# Patient Record
Sex: Male | Born: 2005 | Race: White | Hispanic: No | Marital: Single | Smoking: Never smoker
Health system: Southern US, Community
[De-identification: ages and names within clinical notes are randomized; demographics above are authoritative.]

---
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
## 2023-01-15 ENCOUNTER — Encounter: Payer: Self-pay | Admitting: Psychiatry

## 2023-01-15 ENCOUNTER — Telehealth: Payer: Commercial Managed Care - PPO | Admitting: Psychiatry

## 2023-01-15 DIAGNOSIS — F209 Schizophrenia, unspecified: Secondary | ICD-10-CM | POA: Diagnosis not present

## 2023-01-15 DIAGNOSIS — F422 Mixed obsessional thoughts and acts: Secondary | ICD-10-CM

## 2023-01-15 MED ORDER — SERTRALINE HCL 100 MG PO TABS: 200.0000 mg | ORAL_TABLET | Freq: Every day | ORAL | 1 refills | Status: DC

## 2023-01-15 NOTE — Progress Notes (Signed)
[facility] [address], [place]   Follow-up visit  Date of Service: 01/15/2023   Virtual Visit via Video Note  I connected with pt @ on 01/15/23 at  1:00 PM EST by a video enabled telemedicine application and verified that I am speaking with the correct person using two identifiers.   I discussed the limitations of evaluation and management by telemedicine and the availability of in person appointments. The patient expressed understanding and agreed to proceed.  I discussed the assessment and treatment plan with the patient. The patient was provided an opportunity to ask questions and all were answered. The patient agreed with the plan and demonstrated an understanding of the instructions.   The patient was advised to call back or seek an in-person evaluation if the symptoms worsen or if the condition fails to improve as anticipated.  I provided 25 minutes of non-face-to-face time during this encounter.  The patient was located at home.  The provider was located at [facility].   Alex Hymen, MD   CC/Purpose: Routine medication management follow up.   Alex Bell is a 17 y.o. male with a past psychiatric history of OCD, unspecified schizophrenia who presents today for a psychiatric follow up appointment. Patient is in the custody of himself.    The patient was last seen on 12/13/22, at which time the following plan was established: Medication management:             - Increase sertraline to 150mg  nightly for OCD             - Increase olanzapine to 15mg  nightly for schizophrenia             -  Metformin XR 2000mg  daily with dinner for antipsychotic related weight gain _______________________________________________________________________________________ Acute events/encounters since last visit: none    Alex Bell presents virtually. He states that the past month has been a bit tough. He has been taking his medicine but not  taking the right dose of sertraline. He notes that he is having lots of obsessive thoughts - he saw something online that started this because he felt it may be illegal to see it. He reports that he has been seeking reassurance a lot from his parents. The voices have worsened slightly but are manageable. He is okay with increasing Zoloft. No SI/HI endorsed today.    Sleep: stable Appetite: Stable  Depression: denies Bipolar symptoms:  denies Current suicidal/homicidal ideations:  denied Current auditory/visual hallucinations:  endorsed  Suicide Attempt/Self-Harm History: denies  Psychotherapy: with Alex Bell  Previous psychiatric medication trials:  denies  School: Graduated high school  Started at [employer] - unable to make it through one week of classes Did a semester at [employer] to go back to [employer] this coming semester   Living Situation: lives with mom, dad - with roommate in dorms in Jan 2024    Allergies  Allergen Reactions   Cefdinir     ?possible allergic reaction--hives.      Labs:  reviewed  Medical diagnoses: Patient Active Problem List   Diagnosis Date Noted   Mixed obsessional thoughts and acts 12/14/2021   Schizophrenia (HCC) 12/14/2021   Low body weight due to inadequate caloric intake 12/14/2021    Psychiatric Specialty Exam:   There were no vitals taken for this visit.There is no height or weight on file to calculate BMI.  General Appearance: Neat and Well Groomed  Eye Contact:  Good  Speech:  Clear and Coherent and Normal Rate  Mood:  Euthymic  Affect:  congruent  Thought Process:  Coherent and Goal Directed  Orientation:  Full (Time, Place, and Person)  Thought Content:  auditory hallucinations, stable  Suicidal Thoughts:  No  Homicidal Thoughts:  No  Memory:  Immediate;   Good  Judgement:  Other:  questionable  Insight:  Lacking  Psychomotor Activity:  Normal  Concentration:  Concentration: Good  Recall:  Good   Fund of Knowledge:  Good  Language:  Good  Assets:  Communication Skills Desire for Improvement Intimacy Leisure Time Physical Health Resilience Social Support Talents/Skills Transportation Vocational/Educational  Cognition:  WNL      Assessment   Psychiatric Diagnoses:   ICD-10-CM   1. Schizophrenia, unspecified type (HCC)  F20.9     2. Mixed obsessional thoughts and acts  F42.2        Complexity: Moderate  Patient Education and Counseling:  Supportive therapy provided for identified psychosocial stressors.  Medication education provided and decisions regarding medication regimen discussed with patient/guardian.   On assessment today, Alex Bell has had some worsening of his OCD. He has had increased intrusive thoughts, increased checking, and reassurance seeking. The AVH have worsened slightly but are stable at this time. I feel his OCD is driving his symptoms at this time so we will increase his sertraline. NO HI SI reported.   Plan  Medication management:  - Increase sertraline to 200mg  nightly for OCD  - olanzapine 15mg  nightly for schizophrenia  -  Metformin XR 2000mg  daily with dinner for antipsychotic related weight gain  Labs/Studies:  - A1C and Lipids up to date    Additional recommendations:  - Crisis plan reviewed and patient verbally contracts for safety. Go to ED with emergent symptoms or safety concerns and Risks, benefits, side effects of medications, including any / all black box warnings, discussed with patient, who verbalizes their understanding  - Continue with Alex Bell  - completed PHP at [facility]  - Completed IOP at [facility]   Follow Up: Return in 1 months due to being at school in [place] - Call in the interim for any side-effects, decompensation, questions, or problems between now and the next visit.   I have spent 25 minutes reviewing the patients chart, meeting with the patient and family, and reviewing medicines and side  effects.   Alex Hymen, MD [facility]

## 2023-01-30 ENCOUNTER — Telehealth: Payer: Self-pay | Admitting: Psychiatry

## 2023-01-30 NOTE — Telephone Encounter (Signed)
Alex Bell called at 4:20 to report that his medication is making him very sluggish during the day.  It is also causing him to very sleepy and he has a hard to time getting up in the morning.  If this is going to continue he is going to need accommodations in schools.  Is it possible to adjust or change the medication so he won't have these side effects.  Please call to discuss.  Apt 03/01/23

## 2023-01-30 NOTE — Telephone Encounter (Signed)
From last visit:    - Increase sertraline to 200mg  nightly for OCD             - olanzapine 15mg  nightly for schizophrenia             -  Metformin XR 2000mg  daily with dinner for antipsychotic related weight gain  Patient c/o sleeping with olanzapine, but finds it hard to get up in the morning and has missed several classes. Reports he doesn't think he missed any classes in high school and was not on these medications. He reports mild sluggishness throughout the day. When I ask him if he had these issues at the lower doses he said he thought he did, but he doesn't have a good memory and is not sure. He asks if you can prescribe a medication that doesn't cause the sluggishness. He reports he takes olanzapine about 10 pm. FU 12/19.

## 2023-01-31 NOTE — Telephone Encounter (Signed)
I would recommend moving the dose to an earlier time - maybe around 9/9:30PM for now. If we lower this medicine I worry about him decompensating and having a psychosis episode. We can discuss switching his medicine at his next appointment, but wouldn't do this outside of an appointment. The winter break could be a good time to do this if he is at home for that time period

## 2023-01-31 NOTE — Telephone Encounter (Signed)
LVM to RC 

## 2023-02-01 NOTE — Telephone Encounter (Signed)
Patient returned call and he was informed of the recommendations. He has a MyChart visit 12/19 at 4:30.

## 2023-03-01 ENCOUNTER — Encounter: Payer: Self-pay | Admitting: Psychiatry

## 2023-03-01 ENCOUNTER — Telehealth: Payer: Commercial Managed Care - PPO | Admitting: Psychiatry

## 2023-03-01 DIAGNOSIS — F422 Mixed obsessional thoughts and acts: Secondary | ICD-10-CM

## 2023-03-01 DIAGNOSIS — F209 Schizophrenia, unspecified: Secondary | ICD-10-CM

## 2023-03-01 NOTE — Progress Notes (Signed)
[facility] [address], [place]   Follow-up visit  Date of Service: 03/01/2023   Virtual Visit via Video Note  I connected with pt @ on 03/01/23 at  4:30 PM EST by a video enabled telemedicine application and verified that I am speaking with the correct person using two identifiers.   I discussed the limitations of evaluation and management by telemedicine and the availability of in person appointments. The patient expressed understanding and agreed to proceed.  I discussed the assessment and treatment plan with the patient. The patient was provided an opportunity to ask questions and all were answered. The patient agreed with the plan and demonstrated an understanding of the instructions.   The patient was advised to call back or seek an in-person evaluation if the symptoms worsen or if the condition fails to improve as anticipated.  I provided 25 minutes of non-face-to-face time during this encounter.  The patient was located at home.  The provider was located at [facility].   Kendal Hymen, MD   CC/Purpose: Routine medication management follow up.   Alex Bell is a 17 y.o. male with a past psychiatric history of OCD, unspecified schizophrenia who presents today for a psychiatric follow up appointment. Patient is in the custody of himself.    The patient was last seen on 01/15/23, at which time the following plan was established: Medication management:             - Increase sertraline to 200mg  nightly for OCD             - olanzapine 15mg  nightly for schizophrenia             -  Metformin XR 2000mg  daily with dinner for antipsychotic related weight gain _______________________________________________________________________________________ Acute events/encounters since last visit: none    Alex Bell presents virtually. He states that the higher dose of Zoloft seems to be helping his OCD some. He feels less intrusive thoughts,  though they still happen. He notes that his AVH do remain as well, but are quiet and he is able to ignore them. He feels that the medicine is in an okay place. He completed his semester at school and did well. He plans on going back again next semester. He feels his appetite and weight are in an okay place. No SI/HI endorsed today.    Sleep: stable Appetite: Stable  Depression: denies Bipolar symptoms:  denies Current suicidal/homicidal ideations:  denied Current auditory/visual hallucinations:  endorsed  Suicide Attempt/Self-Harm History: denies  Psychotherapy: with Granville Lewis  Previous psychiatric medication trials:  denies  School: Graduated high school  Started at [employer] - unable to make it through one week of classes Did a semester at [employer] to go back to [place] this coming semester   Living Situation: lives with mom, dad - with roommate in dorms in Jan 2024    Allergies  Allergen Reactions   Cefdinir     ?possible allergic reaction--hives.      Labs:  reviewed  Medical diagnoses: Patient Active Problem List   Diagnosis Date Noted   Mixed obsessional thoughts and acts 12/14/2021   Schizophrenia (HCC) 12/14/2021   Low body weight due to inadequate caloric intake 12/14/2021    Psychiatric Specialty Exam:   There were no vitals taken for this visit.There is no height or weight on file to calculate BMI.  General Appearance: Neat and Well Groomed  Eye Contact:  Good  Speech:  Clear  and Coherent and Normal Rate  Mood:  Euthymic  Affect:  congruent  Thought Process:  Coherent and Goal Directed  Orientation:  Full (Time, Place, and Person)  Thought Content:  auditory hallucinations, stable  Suicidal Thoughts:  No  Homicidal Thoughts:  No  Memory:  Immediate;   Good  Judgement:  Other:  questionable  Insight:  Lacking  Psychomotor Activity:  Normal  Concentration:  Concentration: Good  Recall:  Good  Fund of Knowledge:  Good   Language:  Good  Assets:  Communication Skills Desire for Improvement Intimacy Leisure Time Physical Health Resilience Social Support Talents/Skills Transportation Vocational/Educational  Cognition:  WNL      Assessment   Psychiatric Diagnoses:   ICD-10-CM   1. Schizophrenia, unspecified type (HCC)  F20.9     2. Mixed obsessional thoughts and acts  F42.2       Complexity: Moderate  Patient Education and Counseling:  Supportive therapy provided for identified psychosocial stressors.  Medication education provided and decisions regarding medication regimen discussed with patient/guardian.   On assessment today, Alex Bell has had a positive response to the higher Zoloft dose. His OCD appear more controlled, though it remains present. He continues to have intrusive thoughts and AVH at times. He can ignore both, and feels they are more annoying than anything else. His OCD and schizophrenia both appear stable at this time. No evidence of paranoia, delusional thoughts, or disorganization. His weight has remained stable per his reportNO HI SI reported.   Plan  Medication management:  - sertraline 200mg  nightly for OCD  - olanzapine 15mg  nightly for schizophrenia  -  Metformin XR 2000mg  daily with dinner for antipsychotic related weight gain  Labs/Studies:  - A1C and Lipids up to date    Additional recommendations:  - Crisis plan reviewed and patient verbally contracts for safety. Go to ED with emergent symptoms or safety concerns and Risks, benefits, side effects of medications, including any / all black box warnings, discussed with patient, who verbalizes their understanding  - Continue with Granville Lewis  - completed PHP at [facility]  - Completed IOP at [facility]   Follow Up: Return in 2 months - Call in the interim for any side-effects, decompensation, questions, or problems between now and the next visit.   I have spent 25 minutes reviewing the patients chart,  meeting with the patient and family, and reviewing medicines and side effects.   Kendal Hymen, MD [facility]

## 2023-03-05 ENCOUNTER — Ambulatory Visit: Payer: Commercial Managed Care - PPO | Admitting: Psychiatry

## 2023-03-16 ENCOUNTER — Telehealth: Payer: Self-pay | Admitting: Psychiatry

## 2023-03-16 MED ORDER — METFORMIN HCL ER 500 MG PO TB24: 2000.0000 mg | ORAL_TABLET | Freq: Every day | ORAL | 1 refills | Status: DC

## 2023-03-16 NOTE — Telephone Encounter (Signed)
 Alex Bell called at 9:23 to request refill of his Metformin.  Appt 05/03/23.  [facility] [address] [place], [place] - [address] to

## 2023-03-16 NOTE — Telephone Encounter (Signed)
Sent metformin to requested pharmacy.

## 2023-04-10 ENCOUNTER — Other Ambulatory Visit: Payer: Self-pay | Admitting: Psychiatry

## 2023-04-12 ENCOUNTER — Other Ambulatory Visit: Payer: Self-pay | Admitting: Psychiatry

## 2023-04-12 ENCOUNTER — Telehealth: Payer: Self-pay | Admitting: Psychiatry

## 2023-04-12 NOTE — Telephone Encounter (Signed)
LVM per DPR. Patient has a RF available that will be filled today.  We received a RF from the pharmacy on 1/28 and it was refused because there was a RF available.

## 2023-04-12 NOTE — Telephone Encounter (Signed)
Next visit is 05/03/23. Mom says his Metformin was denied by the pharmacy. Could someone please call mom at [phone].

## 2023-05-03 ENCOUNTER — Telehealth: Payer: Commercial Managed Care - PPO | Admitting: Psychiatry

## 2023-05-03 ENCOUNTER — Encounter: Payer: Self-pay | Admitting: Psychiatry

## 2023-05-03 DIAGNOSIS — F422 Mixed obsessional thoughts and acts: Secondary | ICD-10-CM

## 2023-05-03 DIAGNOSIS — F209 Schizophrenia, unspecified: Secondary | ICD-10-CM

## 2023-05-03 MED ORDER — OLANZAPINE 15 MG PO TABS: 15.0000 mg | ORAL_TABLET | Freq: Every day | ORAL | 1 refills | Status: DC

## 2023-05-03 MED ORDER — SERTRALINE HCL 100 MG PO TABS: 200.0000 mg | ORAL_TABLET | Freq: Every day | ORAL | 1 refills | Status: DC

## 2023-05-03 NOTE — Progress Notes (Signed)
 [facility] [address], [place]   Follow-up visit  Date of Service: 05/03/2023   Virtual Visit via Video Note  I connected with pt @ on 05/03/23 at  4:30 PM EST by a video enabled telemedicine application and verified that I am speaking with the correct person using two identifiers.   I discussed the limitations of evaluation and management by telemedicine and the availability of in person appointments. The patient expressed understanding and agreed to proceed.  I discussed the assessment and treatment plan with the patient. The patient was provided an opportunity to ask questions and all were answered. The patient agreed with the plan and demonstrated an understanding of the instructions.   The patient was advised to call back or seek an in-person evaluation if the symptoms worsen or if the condition fails to improve as anticipated.  I provided 25 minutes of non-face-to-face time during this encounter.  The patient was located at home.  The provider was located at [facility].   Kendal Hymen, MD   CC/Purpose: Routine medication management follow up.   Alex Bell is a 18 y.o. male with a past psychiatric history of OCD, unspecified schizophrenia who presents today for a psychiatric follow up appointment. Patient is in the custody of himself.    The patient was last seen on 03/01/23, at which time the following plan was established: Medication management:             - sertraline 200mg  nightly for OCD             - olanzapine 15mg  nightly for schizophrenia             -  Metformin XR 2000mg  daily with dinner for antipsychotic related weight gain_______________ Acute events/encounters since last visit: none    Alex Bell presents virtually. He reports that things have been going well. He has been taking the medicines regularly and denies missing doses. He feels that his OCD symptoms are much better now, and much more manageable.  He notes that he still has some AVH, but that these have not worsened or caused any major issues lately. He has talked back to these at times, but notes that they are not real. No SI/HI endorsed today.    Sleep: stable Appetite: Stable  Depression: denies Bipolar symptoms:  denies Current suicidal/homicidal ideations:  denied Current auditory/visual hallucinations:  endorsed  Suicide Attempt/Self-Harm History: denies  Psychotherapy: with Granville Lewis  Previous psychiatric medication trials:  denies  School: Graduated high school  Started at [employer] - unable to make it through one week of classes Did a semester at [employer] to go back to [place] this coming semester   Living Situation: lives with mom, dad - with roommate in dorms in Jan 2024    Allergies  Allergen Reactions   Cefdinir     ?possible allergic reaction--hives.      Labs:  reviewed  Medical diagnoses: Patient Active Problem List   Diagnosis Date Noted   Mixed obsessional thoughts and acts 12/14/2021   Schizophrenia (HCC) 12/14/2021   Low body weight due to inadequate caloric intake 12/14/2021    Psychiatric Specialty Exam:   There were no vitals taken for this visit.There is no height or weight on file to calculate BMI.  General Appearance: Neat and Well Groomed  Eye Contact:  Good  Speech:  Clear and Coherent and Normal Rate  Mood:  Euthymic  Affect:  congruent  Thought Process:  Coherent and Goal Directed  Orientation:  Full (Time, Place, and Person)  Thought Content:  auditory hallucinations, stable  Suicidal Thoughts:  No  Homicidal Thoughts:  No  Memory:  Immediate;   Good  Judgement:  Other:  questionable  Insight:  Lacking  Psychomotor Activity:  Normal  Concentration:  Concentration: Good  Recall:  Good  Fund of Knowledge:  Good  Language:  Good  Assets:  Communication Skills Desire for Improvement Intimacy Leisure Time Physical Health Resilience Social  Support Talents/Skills Transportation Vocational/Educational  Cognition:  WNL      Assessment   Psychiatric Diagnoses:   ICD-10-CM   1. Schizophrenia, unspecified type (HCC)  F20.9     2. Mixed obsessional thoughts and acts  F42.2        Complexity: Moderate  Patient Education and Counseling:  Supportive therapy provided for identified psychosocial stressors.  Medication education provided and decisions regarding medication regimen discussed with patient/guardian.   On assessment today, Taylor has has noticeable improvement in his OCD symptoms. He still has some intrusive thoughts, but his religious and checking OCD behaviors are minimal at this time. He does continue to experience AVH - he is reality based and organized on interview, though he does note that he has talked back to the voices some lately. Discussed ensuring he is adherent to his medicines. No concerns today. No SI/HI.   Plan  Medication management:  - sertraline 200mg  nightly for OCD  - olanzapine 15mg  nightly for schizophrenia  -  Metformin XR 2000mg  daily with dinner for antipsychotic related weight gain  Labs/Studies:  - A1C and Lipids up to date    Additional recommendations:  - Crisis plan reviewed and patient verbally contracts for safety. Go to ED with emergent symptoms or safety concerns and Risks, benefits, side effects of medications, including any / all black box warnings, discussed with patient, who verbalizes their understanding  - Continue with Granville Lewis  - completed PHP at [facility]  - Completed IOP at [facility]   Follow Up: Return in 2-3 months - Call in the interim for any side-effects, decompensation, questions, or problems between now and the next visit.   I have spent 25 minutes reviewing the patients chart, meeting with the patient and family, and reviewing medicines and side effects.   Kendal Hymen, MD [facility]

## 2023-05-14 ENCOUNTER — Telehealth: Payer: Self-pay | Admitting: Psychiatry

## 2023-05-14 NOTE — Telephone Encounter (Signed)
 Alex Bell called at 3:00 asking about his dosage of Olanzapine.  He had some 12.5mg  and he has been taking those but he said the label was off the bottle.  He did just pick up the 15mg  and want to clarify dose. Please call.

## 2023-05-14 NOTE — Telephone Encounter (Signed)
 Confirmed with notes that patient should be on 15 mg of olanzapine.

## 2023-07-18 ENCOUNTER — Other Ambulatory Visit: Payer: Self-pay | Admitting: Psychiatry

## 2023-08-13 ENCOUNTER — Other Ambulatory Visit: Payer: Self-pay | Admitting: Psychiatry

## 2023-08-15 ENCOUNTER — Telehealth: Payer: Self-pay | Admitting: Psychiatry

## 2023-08-15 MED ORDER — SERTRALINE HCL 100 MG PO TABS: 200.0000 mg | ORAL_TABLET | Freq: Every day | ORAL | 0 refills | Status: DC

## 2023-08-15 MED ORDER — OLANZAPINE 15 MG PO TABS: 15.0000 mg | ORAL_TABLET | Freq: Every day | ORAL | 0 refills | Status: DC

## 2023-08-15 NOTE — Telephone Encounter (Signed)
RF sent to [facility].

## 2023-08-15 NOTE — Telephone Encounter (Signed)
 Pt Lvm @ 10:15a requesting refill of Olanzapine  and Sertaline to [facility] on [address]  Next appt 6/13

## 2023-08-24 ENCOUNTER — Encounter: Payer: Self-pay | Admitting: Psychiatry

## 2023-08-24 ENCOUNTER — Ambulatory Visit: Admitting: Psychiatry

## 2023-08-24 DIAGNOSIS — F209 Schizophrenia, unspecified: Secondary | ICD-10-CM | POA: Diagnosis not present

## 2023-08-24 DIAGNOSIS — F422 Mixed obsessional thoughts and acts: Secondary | ICD-10-CM | POA: Diagnosis not present

## 2023-08-24 MED ORDER — SERTRALINE HCL 100 MG PO TABS: 200.0000 mg | ORAL_TABLET | Freq: Every day | ORAL | 1 refills | Status: DC

## 2023-08-24 MED ORDER — METFORMIN HCL ER 500 MG PO TB24: 2000.0000 mg | ORAL_TABLET | Freq: Every day | ORAL | 1 refills | Status: DC

## 2023-08-24 MED ORDER — OLANZAPINE 20 MG PO TABS: 20.0000 mg | ORAL_TABLET | Freq: Every day | ORAL | 1 refills | Status: DC

## 2023-08-24 NOTE — Progress Notes (Signed)
 [facility] [address], [place]   Follow-up visit  Date of Service: 08/24/2023   CC/Purpose: Routine medication management follow up.   Alex Bell is a 18 y.o. male with a past psychiatric history of OCD, unspecified schizophrenia who presents today for a psychiatric follow up appointment. Patient is in the custody of himself.    The patient was last seen on 05/03/23, at which time the following plan was established: Medication management:             - sertraline  200mg  nightly for OCD             - olanzapine  15mg  nightly for schizophrenia             -  Metformin  XR 2000mg  daily with dinner for antipsychotic related weight gain_______________ Acute events/encounters since last visit: none    Alex Bell presents in person. He reports that things have been going well. He has been home from school for about a month with his family. He has been travelling some with his parents as well. He will be doing summer school soon. He feels that his mood is okay, still notices some OCD, primarily checking behaviors and feeling bad about things. He still hears voices but is able to mostly tune these out. His appetite has been okay - he has lost some weight. He is okay with a higher dose of Zyprexa . No SI/HI endorsed today.    Sleep: stable Appetite: Stable  Depression: denies Bipolar symptoms:  denies Current suicidal/homicidal ideations:  denied Current auditory/visual hallucinations:  endorsed  Suicide Attempt/Self-Harm History: denies  Psychotherapy: with Lady Pier  Previous psychiatric medication trials:  denies  School: Graduated high school  Started at [employer] - unable to make it through one week of classes Did a semester at [employer] to go back to [place] this coming semester   Living Situation: lives with mom, dad - with roommate in dorms in Jan 2024    Allergies  Allergen Reactions   Cefdinir     ?possible  allergic reaction--hives.      Labs:  reviewed  Medical diagnoses: Patient Active Problem List   Diagnosis Date Noted   Mixed obsessional thoughts and acts 12/14/2021   Schizophrenia (HCC) 12/14/2021   Low body weight due to inadequate caloric intake 12/14/2021    Psychiatric Specialty Exam:   There were no vitals taken for this visit.There is no height or weight on file to calculate BMI.  General Appearance: Neat and Well Groomed  Eye Contact:  Good  Speech:  Clear and Coherent and Normal Rate  Mood:  Euthymic  Affect:  congruent  Thought Process:  Coherent and Goal Directed  Orientation:  Full (Time, Place, and Person)  Thought Content:  auditory hallucinations, stable  Suicidal Thoughts:  No  Homicidal Thoughts:  No  Memory:  Immediate;   Good  Judgement:  Other:  questionable  Insight:  Lacking  Psychomotor Activity:  Normal  Concentration:  Concentration: Good  Recall:  Good  Fund of Knowledge:  Good  Language:  Good  Assets:  Communication Skills Desire for Improvement Intimacy Leisure Time Physical Health Resilience Social Support Talents/Skills Transportation Vocational/Educational  Cognition:  WNL      Assessment   Psychiatric Diagnoses:   ICD-10-CM   1. Schizophrenia, unspecified type (HCC)  F20.9     2. Mixed obsessional thoughts and acts  F42.2       Complexity: Moderate  Patient Education and  Counseling:  Supportive therapy provided for identified psychosocial stressors.  Medication education provided and decisions regarding medication regimen discussed with patient/guardian.   On assessment today, Alex Bell has been stable since his last visit. He has some continued OCD symptoms at times and some AVH that remain. On higher doses of Zyprexa  his AVH were even better. His weight has been dropping some, so he is okay with a higher dose. He denies any delusional thoughts about food or worthiness. No SI/HI.   Plan  Medication management:  -  sertraline  200mg  nightly for OCD  - Increase olanzapine  to 20mg  nightly for schizophrenia  -  Metformin  XR 2000mg  daily with dinner for antipsychotic related weight gain  Labs/Studies:  - A1C and Lipids up to date    Additional recommendations:  - Crisis plan reviewed and patient verbally contracts for safety. Go to ED with emergent symptoms or safety concerns and Risks, benefits, side effects of medications, including any / all black box warnings, discussed with patient, who verbalizes their understanding  - Continue with Lady Pier  - completed PHP at [facility]  - Completed IOP at [facility]   Follow Up: Return in 2-3 months - Call in the interim for any side-effects, decompensation, questions, or problems between now and the next visit.   I have spent 25 minutes reviewing the patients chart, meeting with the patient and family, and reviewing medicines and side effects.   Alex Base, MD [facility]

## 2023-10-23 ENCOUNTER — Encounter: Payer: Self-pay | Admitting: Psychiatry

## 2023-10-23 ENCOUNTER — Ambulatory Visit: Admitting: Psychiatry

## 2023-10-23 DIAGNOSIS — F209 Schizophrenia, unspecified: Secondary | ICD-10-CM | POA: Diagnosis not present

## 2023-10-23 DIAGNOSIS — F422 Mixed obsessional thoughts and acts: Secondary | ICD-10-CM | POA: Diagnosis not present

## 2023-10-23 MED ORDER — SERTRALINE HCL 100 MG PO TABS: 200.0000 mg | ORAL_TABLET | Freq: Every day | ORAL | 1 refills | Status: DC

## 2023-10-23 MED ORDER — OLANZAPINE 20 MG PO TABS: 20.0000 mg | ORAL_TABLET | Freq: Every day | ORAL | 1 refills | Status: DC

## 2023-10-23 MED ORDER — METFORMIN HCL ER 500 MG PO TB24: 2000.0000 mg | ORAL_TABLET | Freq: Every day | ORAL | 1 refills | Status: DC

## 2023-10-23 MED ORDER — ARIPIPRAZOLE 5 MG PO TABS
5.0000 mg | ORAL_TABLET | Freq: Every day | ORAL | 1 refills | Status: DC
Start: 2023-10-23 — End: 2023-11-06

## 2023-10-23 NOTE — Progress Notes (Signed)
 [facility] [address], [place]   Follow-up visit  Date of Service: 10/23/2023   CC/Purpose: Routine medication management follow up.   Alex Bell is a 18 y.o. male with a past psychiatric history of OCD, unspecified schizophrenia who presents today for a psychiatric follow up appointment. Patient is in the custody of himself.    The patient was last seen on 08/24/23, at which time the following plan was established: Medication management:             - sertraline  200mg  nightly for OCD             - Increase olanzapine  to 20mg  nightly for schizophrenia             -  Metformin  XR 2000mg  daily with dinner for antipsychotic related weight gain _______________ Acute events/encounters since last visit: none    Alex Bell presents in person. He reports that things have been pretty good since his last visit. He has been taking his medicines regularly. He reports that he continues to see some OCD including checking and some religious thoughts. He also reports that he continues to hear AVH - he feels he can ignore them and doesn't think they have changed. He reports that he first experienced these in 6th grade then again in high school but ignored them. He has several questions about his diagnoses and his medicines. Discussed his prognosis and the likely need for lifelong medicines. No SI/HI endorsed today.    Sleep: stable Appetite: Stable  Depression: denies Bipolar symptoms:  denies Current suicidal/homicidal ideations:  denied Current auditory/visual hallucinations:  endorsed  Suicide Attempt/Self-Harm History: denies  Psychotherapy: with Alm Duran  Previous psychiatric medication trials:  denies  School: Graduated high school  Started at [employer] - unable to make it through one week of classes Did a semester at [employer] to go back to [place] this coming semester   Living Situation: lives with mom, dad - at  [employer] in dorms while at school - in [place]     Allergies  Allergen Reactions   Cefdinir     ?possible allergic reaction--hives.      Labs:  reviewed  Medical diagnoses: Patient Active Problem List   Diagnosis Date Noted   Mixed obsessional thoughts and acts 12/14/2021   Schizophrenia (HCC) 12/14/2021   Low body weight due to inadequate caloric intake 12/14/2021    Psychiatric Specialty Exam:   There were no vitals taken for this visit.There is no height or weight on file to calculate BMI.  General Appearance: Neat and Well Groomed  Eye Contact:  Good  Speech:  Clear and Coherent and Normal Rate  Mood:  Euthymic  Affect:  congruent  Thought Process:  Coherent and Goal Directed  Orientation:  Full (Time, Place, and Person)  Thought Content:  auditory hallucinations, stable  Suicidal Thoughts:  No  Homicidal Thoughts:  No  Memory:  Immediate;   Good  Judgement:  good  Insight:  good  Psychomotor Activity:  Normal  Concentration:  Concentration: Good  Recall:  Good  Fund of Knowledge:  Good  Language:  Good  Assets:  Communication Skills Desire for Improvement Intimacy Leisure Time Physical Health Resilience Social Support Talents/Skills Transportation Vocational/Educational  Cognition:  WNL      Assessment   Psychiatric Diagnoses:   ICD-10-CM   1. Schizophrenia, unspecified type (HCC)  F20.9     2. Mixed obsessional thoughts and acts  F42.2  Complexity: Moderate  Patient Education and Counseling:  Supportive therapy provided for identified psychosocial stressors.  Medication education provided and decisions regarding medication regimen discussed with patient/guardian.   On assessment today, Alex Bell has been stable since his last visit. He continues to experience some OCD including checking and some religious OCD. He also continues to experience AVH - these have not changed dramatically. He is reality based and inquisitive on interview.  Reviewed the prognosis and diagnoses he has, including the lifelong need for medicine for management. No SI/HI.   Plan  Medication management:  - sertraline  200mg  nightly for OCD  - olanzapine  20mg  nightly for schizophrenia  - Start aripiprazole  5mg  daily for schizophrenia and OCD  -  Metformin  XR 2000mg  daily with dinner for antipsychotic related weight gain  Labs/Studies:  - A1C and Lipids up to date    Additional recommendations:  - Crisis plan reviewed and patient verbally contracts for safety. Go to ED with emergent symptoms or safety concerns and Risks, benefits, side effects of medications, including any / all black box warnings, discussed with patient, who verbalizes their understanding  - Continue with Alm Duran  - completed PHP at [facility]  - Completed IOP at [facility]   Follow Up: Return in 3 months - Call in the interim for any side-effects, decompensation, questions, or problems between now and the next visit.   I have spent 25 minutes reviewing the patients chart, meeting with the patient and family, and reviewing medicines and side effects.   Selinda GORMAN Lauth, MD [facility]

## 2023-10-26 ENCOUNTER — Other Ambulatory Visit: Payer: Self-pay

## 2023-10-26 ENCOUNTER — Telehealth: Payer: Self-pay | Admitting: Psychiatry

## 2023-10-26 NOTE — Telephone Encounter (Signed)
 Told patient he could take AM or PM depending on how he responded to it. If it makes him sleepy take at night, if he has insomnia take in the AM.

## 2023-10-26 NOTE — Telephone Encounter (Signed)
 Pt lvm 12:12 asking if he should take new med day or night. Please advise. Contact # [phone]

## 2023-11-06 ENCOUNTER — Telehealth: Payer: Self-pay | Admitting: Psychiatry

## 2023-11-06 MED ORDER — ARIPIPRAZOLE 5 MG PO TABS: 5.0000 mg | ORAL_TABLET | Freq: Every day | ORAL | 1 refills | Status: DC

## 2023-11-06 MED ORDER — METFORMIN HCL ER 500 MG PO TB24: 2000.0000 mg | ORAL_TABLET | Freq: Every day | ORAL | 1 refills | Status: DC

## 2023-11-06 NOTE — Telephone Encounter (Signed)
 Patient called in stating he was getting his prescription refilled due to lost medications. He lost his Metformin  500mg  and Aripirazole 5mg  and needs sent to pharmacy. Ph: [phone] Appt 12/22 Pharm[facility] [address] [place], [place]

## 2023-11-06 NOTE — Telephone Encounter (Signed)
 Rx for Abilify  and Metformin  sent to [place] pharmacy.

## 2023-11-08 ENCOUNTER — Telehealth: Payer: Self-pay | Admitting: Psychiatry

## 2023-11-08 MED ORDER — OLANZAPINE 20 MG PO TABS: 20.0000 mg | ORAL_TABLET | Freq: Every day | ORAL | 1 refills | Status: DC

## 2023-11-08 NOTE — Telephone Encounter (Signed)
 Sent [place]anzapine  to [place] pharmacy.

## 2023-11-08 NOTE — Telephone Encounter (Signed)
 Pt called at 8/28.  He said he is in [place].  He si running low on his Olanzapine .  He said his current pill bottle shows 30 pills.  He asked his parents to look for it at home in [facility] and they can't find it.  He's asking for refill of 30 pills to  [facility] [id number], [place] - [address] [place], [place] [address] Phone: [phone]  Fax: [phone]      Next appt 12/22

## 2023-11-13 ENCOUNTER — Telehealth: Payer: Self-pay | Admitting: Psychiatry

## 2023-11-13 NOTE — Telephone Encounter (Signed)
 Pt called wanting to know if he can increase his zoloft . Also with his 2 diagnosis does that mean he  has a psychiatric disease. Please call him at [phone]

## 2023-11-13 NOTE — Telephone Encounter (Signed)
 Pt asking to increase Zoloft . He reports his OCD is not worse, but it slows him down and he is not able to work as efficiently in school. He also had questions about his diagnoses and I did tell him that he has psychiatric diagnoses. He then said to hold off on doing anything with the Zoloft , that he hadn't been to see his counselor in a while and wanted to talk with him first.

## 2023-11-13 NOTE — Telephone Encounter (Signed)
 Disorder not disease

## 2023-11-15 ENCOUNTER — Telehealth: Payer: Self-pay | Admitting: Psychiatry

## 2023-11-15 MED ORDER — SERTRALINE HCL 100 MG PO TABS: 200.0000 mg | ORAL_TABLET | Freq: Every day | ORAL | 1 refills | Status: DC

## 2023-11-15 NOTE — Telephone Encounter (Signed)
 Next visit is 03/03/24. Gilmer called stating that he would like a prescription of Sertraline  200 mg (full dosage).Pharmacy is:  [facility] [id number] - [place], [place] - [address]   Phone: [phone]  Fax: [phone]

## 2023-11-15 NOTE — Telephone Encounter (Signed)
 Sent!

## 2024-01-23 ENCOUNTER — Telehealth: Payer: Self-pay | Admitting: Psychiatry

## 2024-01-23 NOTE — Telephone Encounter (Signed)
 Alex Bell called in at 4:46 and LM. He said that the last few days he has experienced twitching in his entire body. He is concerned it may be med related.

## 2024-01-24 NOTE — Telephone Encounter (Signed)
 LVM to [facility]

## 2024-01-24 NOTE — Telephone Encounter (Signed)
 Pt last seen in August.  Medication management:             - sertraline  200mg  nightly for OCD             - olanzapine  20mg  nightly for schizophrenia             - Start aripiprazole  5mg  daily for schizophrenia and OCD             -  Metformin  XR 2000mg  daily with dinner for antipsychotic related weight gain  He is reporting for the past several days he is having tingling all over his body. It is not continuous, tends to pop up at bedtime. No new medication changes. No other sx/issues.

## 2024-01-25 NOTE — Telephone Encounter (Signed)
Advised patient of recommendation.

## 2024-01-25 NOTE — Telephone Encounter (Signed)
 I would give it more time to see if this persists. If it continues for another week we can look at slightly lowering his Zyprexa  dose

## 2024-02-05 ENCOUNTER — Telehealth: Payer: Self-pay | Admitting: Psychiatry

## 2024-02-05 NOTE — Telephone Encounter (Signed)
 Pt Lvm @ 3:10p  that the involuntary twitches has lessen but he's still having them and it's been longer than the week you told him to wait to see if it would get better.  He wants to discuss.   Next appt 12/29

## 2024-02-06 NOTE — Telephone Encounter (Signed)
 He can reduce Abilify  to 2.5mg  daily and see how this does

## 2024-02-06 NOTE — Telephone Encounter (Signed)
 Pt aware and will contact office for further questions

## 2024-02-06 NOTE — Telephone Encounter (Signed)
 Previous phone message 11/13 advised to give more time.

## 2024-03-03 ENCOUNTER — Ambulatory Visit: Admitting: Psychiatry

## 2024-03-10 ENCOUNTER — Ambulatory Visit (INDEPENDENT_AMBULATORY_CARE_PROVIDER_SITE_OTHER): Admitting: Psychiatry

## 2024-03-10 ENCOUNTER — Encounter: Payer: Self-pay | Admitting: Psychiatry

## 2024-03-10 DIAGNOSIS — F422 Mixed obsessional thoughts and acts: Secondary | ICD-10-CM

## 2024-03-10 DIAGNOSIS — F209 Schizophrenia, unspecified: Secondary | ICD-10-CM | POA: Diagnosis not present

## 2024-03-10 MED ORDER — SERTRALINE HCL 100 MG PO TABS: 200.0000 mg | ORAL_TABLET | Freq: Every day | ORAL | 1 refills | Status: AC

## 2024-03-10 MED ORDER — ARIPIPRAZOLE 2 MG PO TABS: 2.0000 mg | ORAL_TABLET | Freq: Every day | ORAL | 1 refills | Status: AC

## 2024-03-10 MED ORDER — OLANZAPINE 20 MG PO TABS: 20.0000 mg | ORAL_TABLET | Freq: Every day | ORAL | 1 refills | Status: AC

## 2024-03-10 MED ORDER — METFORMIN HCL ER 500 MG PO TB24: 2000.0000 mg | ORAL_TABLET | Freq: Every day | ORAL | 1 refills | Status: AC

## 2024-03-10 NOTE — Progress Notes (Signed)
 "  [facility] [address], [place]   Follow-up visit  Date of Service: 03/10/2024   CC/Purpose: Routine medication management follow up.   Ritik Cambridge is a 18 y.o. male with a past psychiatric history of OCD, unspecified schizophrenia who presents today for a psychiatric follow up appointment. Patient is in the custody of himself.    The patient was last seen on 10/23/23, at which time the following plan was established: Medication management:             - sertraline  200mg  nightly for OCD             - olanzapine  20mg  nightly for schizophrenia             - Start aripiprazole  5mg  daily for schizophrenia and OCD             -  Metformin  XR 2000mg  daily with dinner for antipsychotic related weight gain _______________ Acute events/encounters since last visit: none    Jayshon presents in person. He reports that his semester went pretty well. The only new issue is that he is having trouble with reading and processing information. He now reads and has accommodations to hear the words he reads. This has helped some. He feels that the OCD and voices are both stable - he has noticed his OCD improving a fair amount. No SI/HI endorsed today.    Sleep: stable Appetite: Stable  Depression: denies Bipolar symptoms:  denies Current suicidal/homicidal ideations:  denied Current auditory/visual hallucinations:  endorsed  Suicide Attempt/Self-Harm History: denies  Psychotherapy: with Alm Duran  Previous psychiatric medication trials:  denies  School: Graduated high school  Started at [employer] - unable to make it through one week of classes Did a semester at [employer] to go back to [place] this coming semester   Living Situation: lives with mom, dad - at [place] in dorms while at school - in [place]     Allergies  Allergen Reactions   Cefdinir     ?possible allergic reaction--hives.      Labs:  reviewed  Medical  diagnoses: Patient Active Problem List   Diagnosis Date Noted   Mixed obsessional thoughts and acts 12/14/2021   Schizophrenia (HCC) 12/14/2021   Low body weight due to inadequate caloric intake 12/14/2021    Psychiatric Specialty Exam:   There were no vitals taken for this visit.There is no height or weight on file to calculate BMI.  General Appearance: Neat and Well Groomed  Eye Contact:  Good  Speech:  Clear and Coherent and Normal Rate  Mood:  Euthymic  Affect:  congruent  Thought Process:  Coherent and Goal Directed  Orientation:  Full (Time, Place, and Person)  Thought Content:  auditory hallucinations, stable  Suicidal Thoughts:  No  Homicidal Thoughts:  No  Memory:  Immediate;   Good  Judgement:  good  Insight:  good  Psychomotor Activity:  Normal  Concentration:  Concentration: Good  Recall:  Good  Fund of Knowledge:  Good  Language:  Good  Assets:  Communication Skills Desire for Improvement Intimacy Leisure Time Physical Health Resilience Social Support Talents/Skills Transportation Vocational/Educational  Cognition:  WNL      Assessment   Psychiatric Diagnoses:   ICD-10-CM   1. Schizophrenia, unspecified type (HCC)  F20.9     2. Mixed obsessional thoughts and acts  F42.2        Complexity: Moderate  Patient Education and Counseling:  Supportive therapy provided  for identified psychosocial stressors.  Medication education provided and decisions regarding medication regimen discussed with patient/guardian.   On assessment today, Keithan has been stable since his last visit. His OCD appears to be gradually improving. His AVH appear stable and minimal. He is not gaining weight at this time. We will monitor his processing, which he reports has decreased lately. No SI/HI.   Plan  Medication management:  - sertraline  200mg  nightly for OCD  - olanzapine  20mg  nightly for schizophrenia  - decrease aripiprazole  to 2mg  daily for schizophrenia and  OCD  -  Metformin  XR 2000mg  daily with dinner for antipsychotic related weight gain  Labs/Studies:  - A1C and Lipids up to date    Additional recommendations:  - Crisis plan reviewed and patient verbally contracts for safety. Go to ED with emergent symptoms or safety concerns and Risks, benefits, side effects of medications, including any / all black box warnings, discussed with patient, who verbalizes their understanding  - Continue with Alm Duran  - completed PHP at [facility]  - Completed IOP at [facility]   Follow Up: Return in 4 months - Call in the interim for any side-effects, decompensation, questions, or problems between now and the next visit.   I have spent 25 minutes reviewing the patients chart, meeting with the patient and family, and reviewing medicines and side effects.   Selinda GORMAN Lauth, MD [facility]     "

## 2024-05-19 ENCOUNTER — Ambulatory Visit: Admitting: Psychiatry
